# Patient Record
Sex: Female | Born: 2005 | Race: White | Hispanic: Yes | Marital: Single | Smoking: Never smoker
Health system: Southern US, Community
[De-identification: ages and names within clinical notes are randomized; demographics above are authoritative.]

## PROBLEM LIST (undated history)

## (undated) DIAGNOSIS — E063 Autoimmune thyroiditis: Secondary | ICD-10-CM

## (undated) DIAGNOSIS — D509 Iron deficiency anemia, unspecified: Secondary | ICD-10-CM

## (undated) DIAGNOSIS — Z789 Other specified health status: Secondary | ICD-10-CM

## (undated) DIAGNOSIS — E282 Polycystic ovarian syndrome: Secondary | ICD-10-CM

## (undated) HISTORY — DX: Polycystic ovarian syndrome: E28.2

## (undated) HISTORY — DX: Autoimmune thyroiditis: E06.3

## (undated) HISTORY — DX: Iron deficiency anemia, unspecified: D50.9

## (undated) HISTORY — DX: Other specified health status: Z78.9

---
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
## 2023-01-04 DIAGNOSIS — F411 Generalized anxiety disorder: Secondary | ICD-10-CM | POA: Diagnosis not present

## 2023-01-18 DIAGNOSIS — F411 Generalized anxiety disorder: Secondary | ICD-10-CM | POA: Diagnosis not present

## 2023-02-01 DIAGNOSIS — F411 Generalized anxiety disorder: Secondary | ICD-10-CM | POA: Diagnosis not present

## 2023-02-15 DIAGNOSIS — F411 Generalized anxiety disorder: Secondary | ICD-10-CM | POA: Diagnosis not present

## 2023-02-26 ENCOUNTER — Other Ambulatory Visit: Payer: Self-pay

## 2023-02-26 ENCOUNTER — Encounter (HOSPITAL_COMMUNITY): Payer: Self-pay | Admitting: Emergency Medicine

## 2023-02-26 ENCOUNTER — Ambulatory Visit (HOSPITAL_COMMUNITY)
Admission: EM | Admit: 2023-02-26 | Discharge: 2023-02-26 | Disposition: A | Discharge status: Home / Self Care | Payer: Commercial Managed Care - HMO | Attending: Urgent Care | Admitting: Urgent Care

## 2023-02-26 DIAGNOSIS — L309 Dermatitis, unspecified: Secondary | ICD-10-CM | POA: Diagnosis not present

## 2023-02-26 MED ORDER — DESONIDE 0.05 % EX CREA: TOPICAL_CREAM | Freq: Two times a day (BID) | CUTANEOUS | 0 refills | Status: AC

## 2023-02-26 MED ORDER — PREDNISONE 20 MG PO TABS: 20.0000 mg | ORAL_TABLET | Freq: Every day | ORAL | 0 refills | Status: AC

## 2023-02-26 NOTE — ED Triage Notes (Signed)
Redness, dryness around both eyes.  Denies any changes in skin care routine or products.    Has not used any medications on this .    No changes in vision
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
## 2023-02-26 NOTE — Discharge Instructions (Addendum)
You have a periocular dermatitis, which is similar to an allergic reaction of the skin.  Please take the oral prednisone once daily with breakfast until gone. Use the topical desonide cream to the affected areas of the upper eyelid twice daily until resolved, but DO NOT exceed >14 days of use. DO NOT get in the eye, this is for external use only.  Return for recheck if your symptoms are not resolved within 10-14 days.

## 2023-03-01 ENCOUNTER — Other Ambulatory Visit (INDEPENDENT_AMBULATORY_CARE_PROVIDER_SITE_OTHER): Payer: Self-pay | Admitting: Pediatrics

## 2023-03-01 DIAGNOSIS — F411 Generalized anxiety disorder: Secondary | ICD-10-CM | POA: Diagnosis not present

## 2023-03-01 DIAGNOSIS — E282 Polycystic ovarian syndrome: Secondary | ICD-10-CM

## 2023-03-01 DIAGNOSIS — D509 Iron deficiency anemia, unspecified: Secondary | ICD-10-CM

## 2023-03-16 ENCOUNTER — Encounter (INDEPENDENT_AMBULATORY_CARE_PROVIDER_SITE_OTHER): Payer: Self-pay

## 2023-03-28 DIAGNOSIS — F411 Generalized anxiety disorder: Secondary | ICD-10-CM | POA: Diagnosis not present

## 2023-04-11 DIAGNOSIS — F411 Generalized anxiety disorder: Secondary | ICD-10-CM | POA: Diagnosis not present

## 2023-04-25 DIAGNOSIS — F411 Generalized anxiety disorder: Secondary | ICD-10-CM | POA: Diagnosis not present

## 2023-05-09 DIAGNOSIS — F411 Generalized anxiety disorder: Secondary | ICD-10-CM | POA: Diagnosis not present

## 2023-05-12 DIAGNOSIS — F411 Generalized anxiety disorder: Secondary | ICD-10-CM | POA: Diagnosis not present

## 2023-05-22 ENCOUNTER — Other Ambulatory Visit (INDEPENDENT_AMBULATORY_CARE_PROVIDER_SITE_OTHER): Payer: Self-pay | Admitting: Pediatrics

## 2023-05-22 DIAGNOSIS — E282 Polycystic ovarian syndrome: Secondary | ICD-10-CM

## 2023-05-22 DIAGNOSIS — D509 Iron deficiency anemia, unspecified: Secondary | ICD-10-CM

## 2023-05-23 DIAGNOSIS — F411 Generalized anxiety disorder: Secondary | ICD-10-CM | POA: Diagnosis not present

## 2023-06-06 DIAGNOSIS — F411 Generalized anxiety disorder: Secondary | ICD-10-CM | POA: Diagnosis not present

## 2023-06-20 DIAGNOSIS — F411 Generalized anxiety disorder: Secondary | ICD-10-CM | POA: Diagnosis not present

## 2023-07-04 DIAGNOSIS — F411 Generalized anxiety disorder: Secondary | ICD-10-CM | POA: Diagnosis not present

## 2023-08-01 DIAGNOSIS — F411 Generalized anxiety disorder: Secondary | ICD-10-CM | POA: Diagnosis not present

## 2023-08-17 DIAGNOSIS — F411 Generalized anxiety disorder: Secondary | ICD-10-CM | POA: Diagnosis not present

## 2023-08-26 ENCOUNTER — Telehealth: Payer: Self-pay

## 2023-08-26 DIAGNOSIS — Z0000 Encounter for general adult medical examination without abnormal findings: Secondary | ICD-10-CM

## 2023-08-29 DIAGNOSIS — F411 Generalized anxiety disorder: Secondary | ICD-10-CM | POA: Diagnosis not present

## 2023-08-30 ENCOUNTER — Telehealth: Payer: Self-pay | Admitting: *Deleted

## 2023-08-30 NOTE — Progress Notes (Signed)
 Complex Care Management Note  Care Guide Note 08/30/2023 Name: Emily Bridges MRN: [id number] DOB: 02-Oct-2005  Emily Bridges is a 18 y.o. year old female who sees [place], Sotero LABOR, MD for primary care. I reached out to Emily Bridges by phone today to offer complex care management services.  Emily Bridges was given information about Complex Care Management services today including:   The Complex Care Management services include support from the care team which includes your Nurse Care Manager, Clinical Social Worker, or Pharmacist.  The Complex Care Management team is here to help remove barriers to the health concerns and goals most important to you. Complex Care Management services are voluntary, and the patient may decline or stop services at any time by request to their care team member.   Complex Care Management Consent Status: Patient agreed to services and verbal consent obtained.   Follow up plan:  Telephone appointment with complex care management team member scheduled for:  09/06/23  Encounter Outcome:  Patient Scheduled

## 2023-09-06 ENCOUNTER — Other Ambulatory Visit: Payer: Self-pay | Admitting: Licensed Clinical Social Worker

## 2023-09-07 NOTE — Patient Outreach (Signed)
 Complex Care Management   Visit Note  09/07/2023  Name:  Emily Bridges MRN: [id number] DOB: January 23, 2006  Situation: Referral received for Complex Care Management related to Mental/Behavioral Health diagnosis GAD I obtained verbal consent from Patient.  Visit completed with patient  on the phone  Background:   Past Medical History:  Diagnosis Date   Chronic lymphocytic thyroiditis    Dysfunctional uterine bleeding 10/24/2020   Iron deficiency anemia    PCOS (polycystic ovarian syndrome)     Assessment: Patient Reported Symptoms:  Cognitive Cognitive Status: Alert and oriented to person, place, and time, Normal speech and language skills, Insightful and able to interpret abstract concepts      Neurological Neurological Review of Symptoms: No symptoms reported    HEENT HEENT Symptoms Reported: No symptoms reported      Cardiovascular Cardiovascular Symptoms Reported: No symptoms reported    Respiratory Respiratory Symptoms Reported: No symptoms reported    Endocrine Endocrine Symptoms Reported: No symptoms reported    Gastrointestinal Gastrointestinal Symptoms Reported: No symptoms reported      Genitourinary Genitourinary Symptoms Reported: No symptoms reported    Integumentary Integumentary Symptoms Reported: No symptoms reported    Musculoskeletal Musculoskelatal Symptoms Reviewed: No symptoms reported        Psychosocial Psychosocial Symptoms Reported: No symptoms reported Additional Psychological Details: Pt is currently being followed by a psychiatrist and therapist, denied any MH needs at this time. Behavioral Management Strategies: Medication therapy, Coping strategies, Counseling Behavioral Health Self-Management Outcome: 4 (good) Techniques to Cope with Loss/Stress/Change: Counseling, Medication Do you feel physically threatened by others?: No      10/28/2021    5:18 PM  Depression screen PHQ 2/9  Decreased Interest 1  Down, Depressed, Hopeless 2  PHQ - 2  Score 3  Altered sleeping 1  Tired, decreased energy 1  Change in appetite 2  Feeling bad or failure about yourself  2  Trouble concentrating 0  Moving slowly or fidgety/restless 0  PHQ-9 Score 9    There were no vitals filed for this visit.  Medications Reviewed Today   Medications were not reviewed in this encounter     Recommendation:   Identified available providers and spoke with practice to schedule new appt Spoke with Medicaid management organization and requested change in provider. Reviewed process for contacting Medicaid organization and requesting changes/asking questions Reviewed contacting provider office once established for health needs.    Follow Up Plan:   Patient has met all care management goals. Care Management case will be closed. Patient has been provided contact information should new needs arise.   Alm Armor, LCSW [facility], [facility] Licensed Clinical Social Worker Care Coordinator [phone]

## 2023-09-07 NOTE — Patient Instructions (Signed)
 Visit Information  Thank you for taking time to visit with me today. Please don't hesitate to contact me if I can be of assistance to you before our next scheduled appointment.  Our next appointment is no further scheduled appointments.   Please call the care guide team at [phone] if you need to cancel or reschedule your appointment.   Following is a copy of your care plan:   Goals Addressed             This Visit's Progress    COMPLETED: [facility] Social Work Care Plan       Problems:   Lacks knowledge of how to connect to establish new provider and adjust Medicaid  CSW Clinical Goal(s):   Over the next 6 weeks the Patient will work with Child psychotherapist to address concerns related to establishing a new provider.  Interventions:  Identified available providers and spoke with practice to schedule new appt Spoke with Medicaid management organization and requested change in provider. Reviewed process for contacting Medicaid organization and requesting changes/asking questions Reviewed contacting provider office once established for health needs.  Patient Goals/Self-Care Activities:  Continue taking your medication as prescribed.   Follow up with provider office and Medicaid organization as needed.  Plan:   No further follow up required: Please request re-referral as needed.        Please call the Suicide and Crisis Lifeline: 988 if you are experiencing a Mental Health or Behavioral Health Crisis or need someone to talk to.  Patient verbalizes understanding of instructions and care plan provided today and agrees to view in MyChart. Active MyChart status and patient understanding of how to access instructions and care plan via MyChart confirmed with patient.     Alm Armor, LCSW [place], [facility] Licensed Clinical Social Worker Care Coordinator [phone]

## 2023-09-12 DIAGNOSIS — F411 Generalized anxiety disorder: Secondary | ICD-10-CM | POA: Diagnosis not present

## 2023-09-26 DIAGNOSIS — F411 Generalized anxiety disorder: Secondary | ICD-10-CM | POA: Diagnosis not present

## 2023-10-11 DIAGNOSIS — F411 Generalized anxiety disorder: Secondary | ICD-10-CM | POA: Diagnosis not present

## 2023-10-25 DIAGNOSIS — F411 Generalized anxiety disorder: Secondary | ICD-10-CM | POA: Diagnosis not present

## 2023-11-07 ENCOUNTER — Ambulatory Visit (HOSPITAL_BASED_OUTPATIENT_CLINIC_OR_DEPARTMENT_OTHER): Admitting: Family Medicine

## 2023-11-07 ENCOUNTER — Encounter (HOSPITAL_BASED_OUTPATIENT_CLINIC_OR_DEPARTMENT_OTHER): Payer: Self-pay

## 2023-11-08 DIAGNOSIS — F411 Generalized anxiety disorder: Secondary | ICD-10-CM | POA: Diagnosis not present

## 2023-11-22 DIAGNOSIS — F411 Generalized anxiety disorder: Secondary | ICD-10-CM | POA: Diagnosis not present

## 2023-12-26 DIAGNOSIS — F411 Generalized anxiety disorder: Secondary | ICD-10-CM | POA: Diagnosis not present

## 2024-01-03 DIAGNOSIS — F411 Generalized anxiety disorder: Secondary | ICD-10-CM | POA: Diagnosis not present
